# Patient Record
Sex: Female | Born: 2000 | Race: White | Hispanic: No | Marital: Single | Smoking: Never smoker
Health system: Southern US, Community
[De-identification: ages and names within clinical notes are randomized; demographics above are authoritative.]

## PROBLEM LIST (undated history)

## (undated) DIAGNOSIS — J45909 Unspecified asthma, uncomplicated: Secondary | ICD-10-CM

## (undated) DIAGNOSIS — J069 Acute upper respiratory infection, unspecified: Secondary | ICD-10-CM

## (undated) DIAGNOSIS — L509 Urticaria, unspecified: Secondary | ICD-10-CM

## (undated) HISTORY — DX: Unspecified asthma, uncomplicated: J45.909

## (undated) HISTORY — DX: Acute upper respiratory infection, unspecified: J06.9

## (undated) HISTORY — DX: Urticaria, unspecified: L50.9

## (undated) HISTORY — PX: NO PAST SURGERIES: SHX2092

---
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
## 2019-01-03 ENCOUNTER — Other Ambulatory Visit: Payer: Self-pay | Admitting: Family Medicine

## 2019-01-03 DIAGNOSIS — R221 Localized swelling, mass and lump, neck: Secondary | ICD-10-CM

## 2021-02-14 IMAGING — US US THYROID
1 series · 14 of 25 positions shown · non-contrast
Comparison: None.

CLINICAL DATA: Palpable abnormality.  Right-sided thyroid fullness.

EXAM:
THYROID ULTRASOUND
TECHNIQUE: Ultrasound examination of the thyroid gland and adjacent soft
tissues was performed.

[Series 1: us thyroid · 0.04mm/px · 44 acquisitions, 14 frames shown]
[im 1/44]
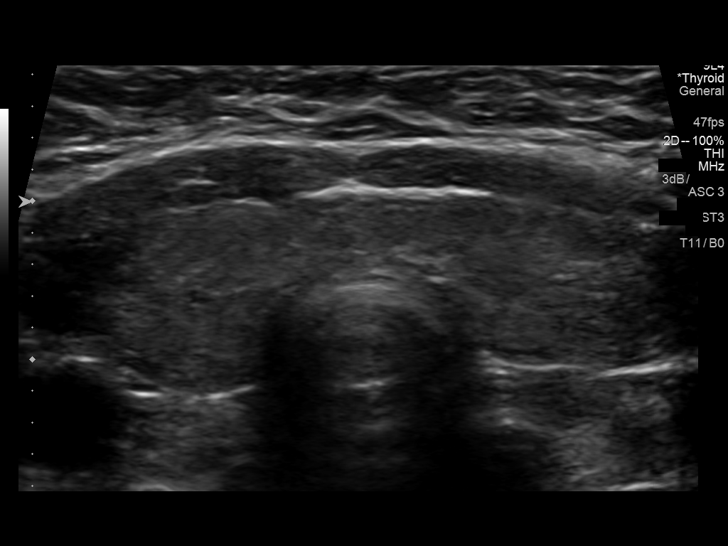
[im 4/44]
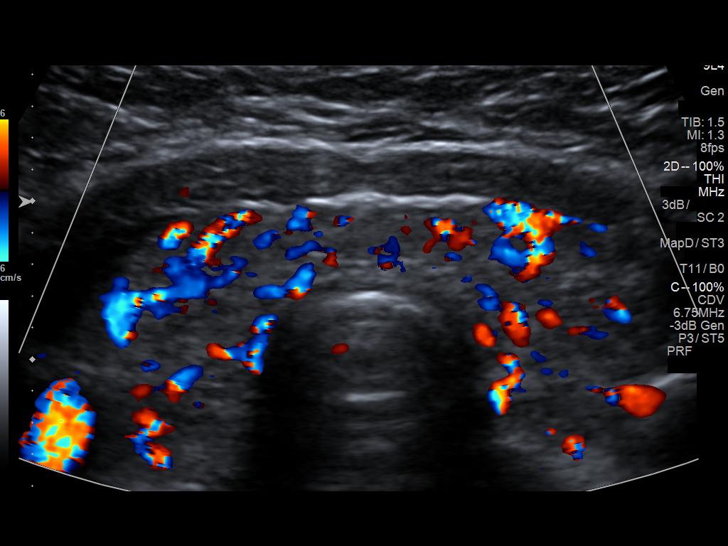
[im 8/44]
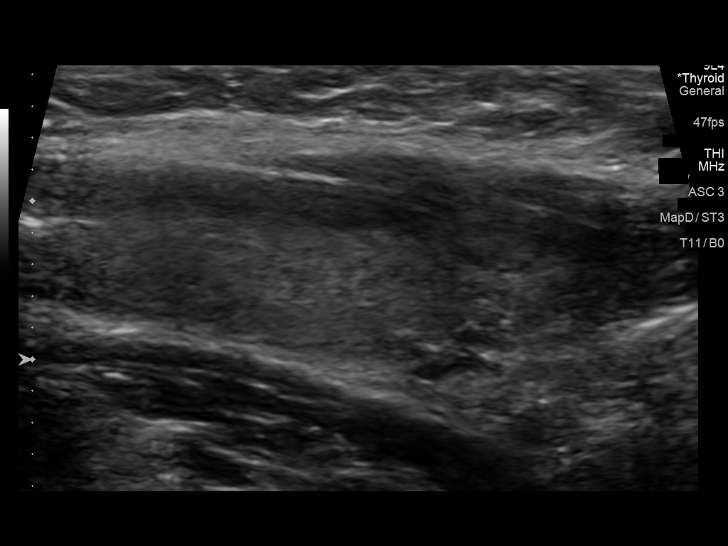
[im 11/44]
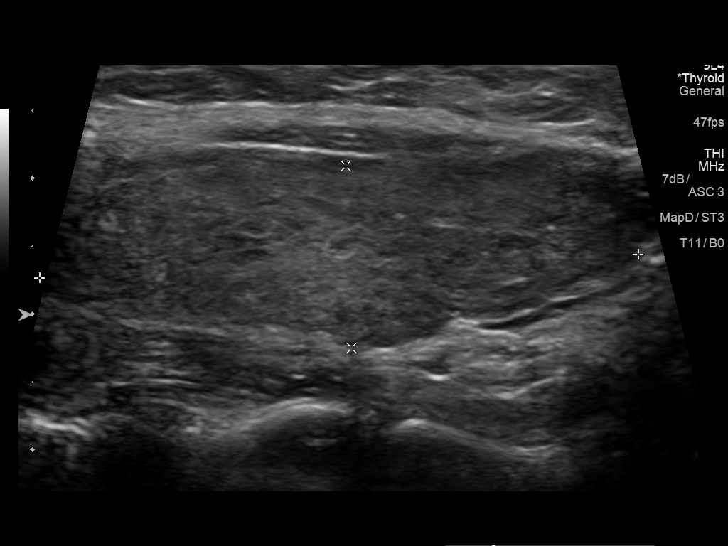
[im 15/44]
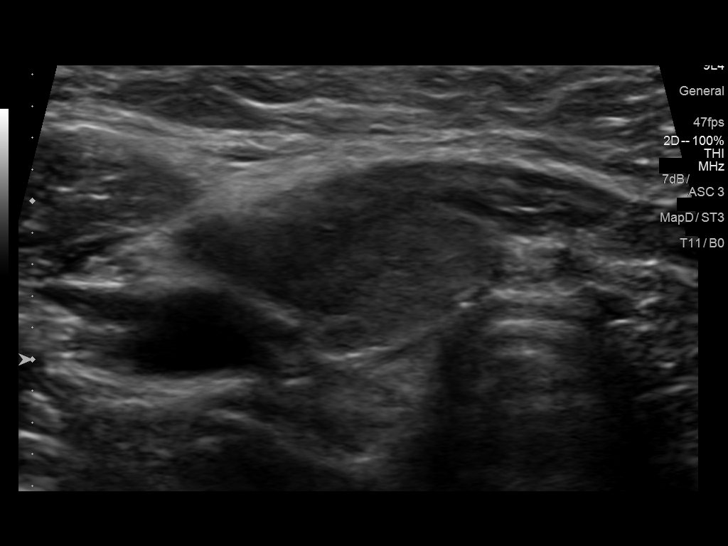
[im 17/44]
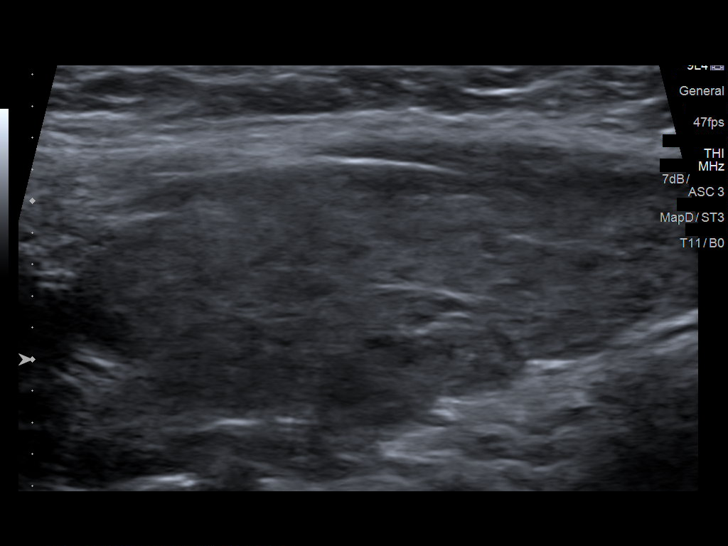
[im 20/44]
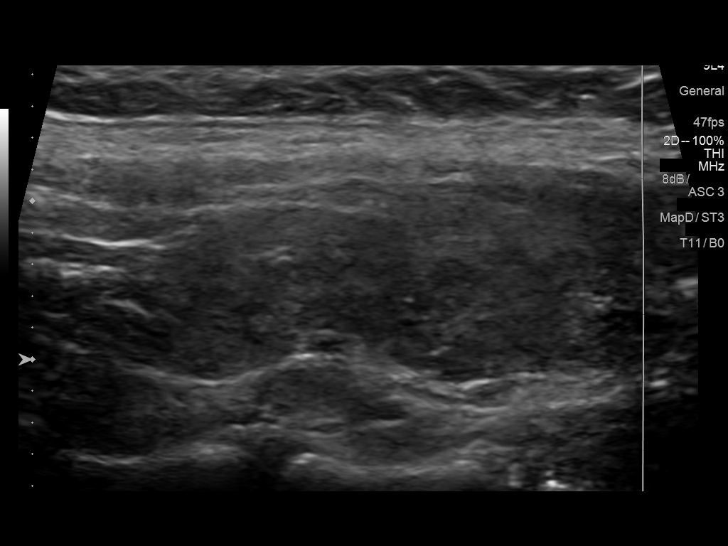
[im 24/44]
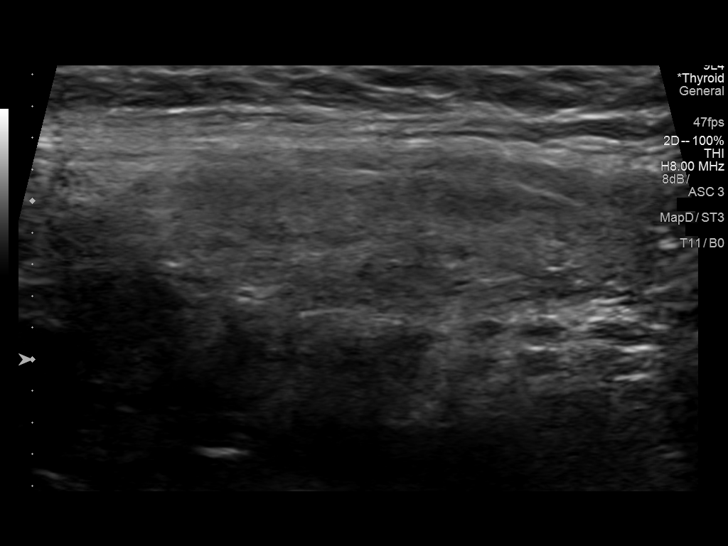
[im 27/44]
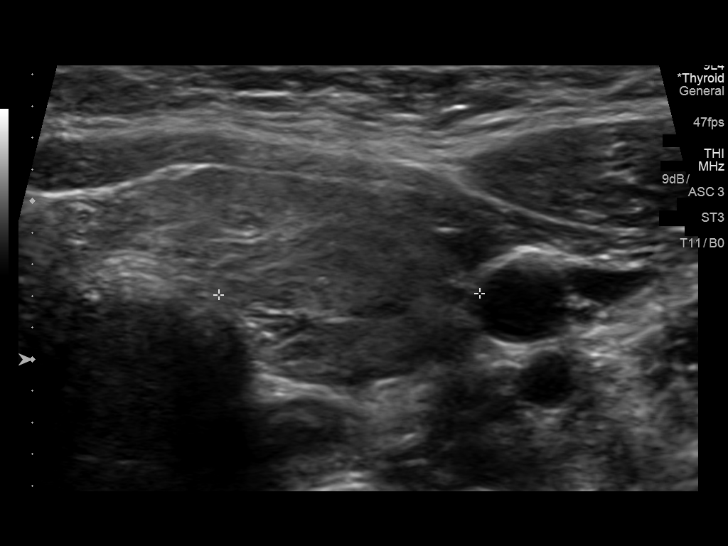
[im 29/44]
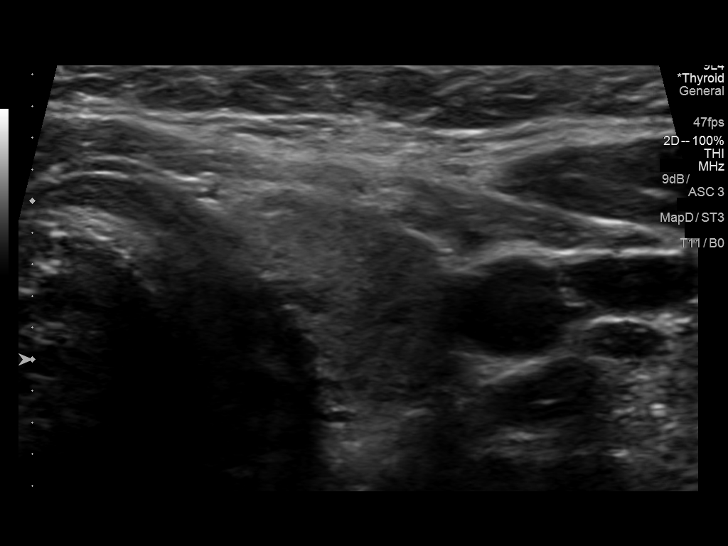
[im 33/44]
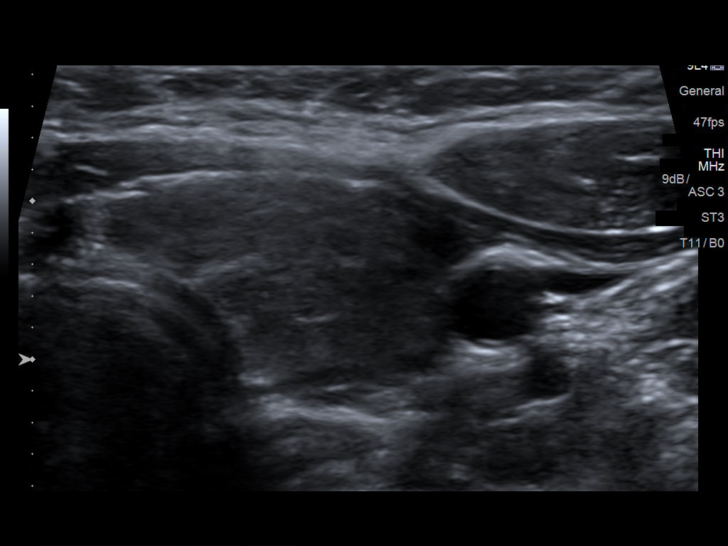
[im 36/44]
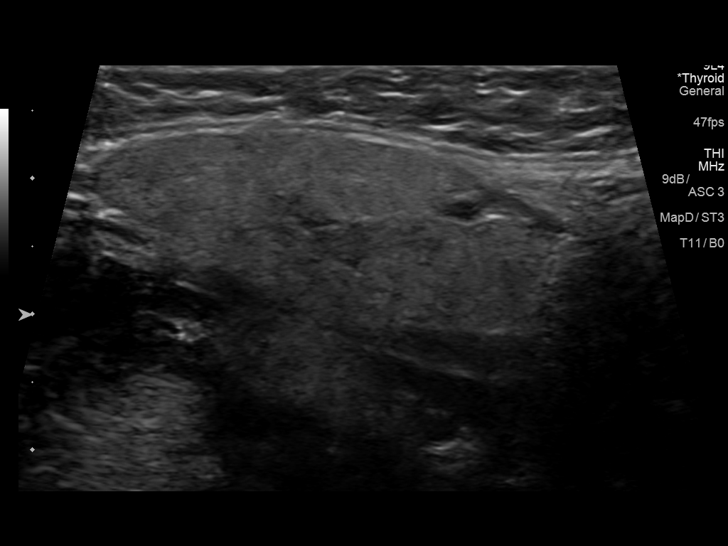
[im 40/44]
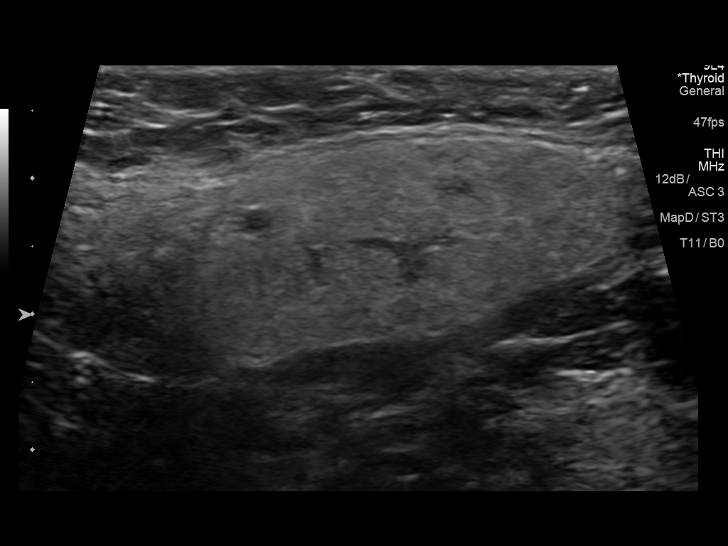
[im 44/44]
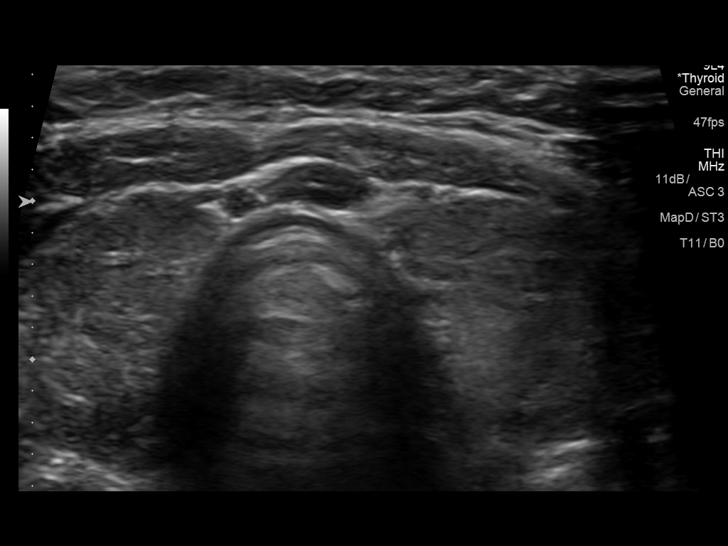

[14 of 25 positions shown; findings below may reference images not displayed]

FINDINGS: Parenchymal Echotexture: Moderately heterogenous - the gland appears
diffusely hyperemic (images 7, 22 and 37).

Isthmus: Normal in size measures 0.4 cm in diameter

Right lobe: Normal in size measuring 4.4 x 1.3 x 1.8 cm

Left lobe: There is normal in size measuring 4.6 x 1.4 x 1.7 cm

_________________________________________________________

Estimated total number of nodules >/= 1 cm: 0

Number of spongiform nodules >/=  2 cm not described below (TR1): 0

Number of mixed cystic and solid nodules >/= 1.5 cm not described
below (TR2): 0

_________________________________________________________

No discrete nodules are seen within the thyroid gland.
IMPRESSION: Normal sized though moderately heterogeneous and potentially
hyperemic thyroid gland without discrete nodule or mass. Findings
are nonspecific though could be seen in the setting of an active
thyroiditis. Clinical correlation is advised.

## 2023-02-12 ENCOUNTER — Ambulatory Visit: Payer: Self-pay | Admitting: Allergy & Immunology

## 2023-03-17 ENCOUNTER — Encounter: Payer: Self-pay | Admitting: Internal Medicine

## 2023-03-17 ENCOUNTER — Ambulatory Visit: Payer: 59 | Admitting: Internal Medicine

## 2023-03-17 ENCOUNTER — Other Ambulatory Visit: Payer: Self-pay

## 2023-03-17 VITALS — BP 118/86 | HR 103 | Temp 98.8°F | Resp 12 | Ht 63.58 in | Wt 215.8 lb

## 2023-03-17 DIAGNOSIS — J4540 Moderate persistent asthma, uncomplicated: Secondary | ICD-10-CM

## 2023-03-17 DIAGNOSIS — J3089 Other allergic rhinitis: Secondary | ICD-10-CM

## 2023-03-17 DIAGNOSIS — L500 Allergic urticaria: Secondary | ICD-10-CM | POA: Diagnosis not present

## 2023-03-17 MED ORDER — CETIRIZINE HCL 10 MG PO TABS: 10.0000 mg | ORAL_TABLET | Freq: Two times a day (BID) | ORAL | 5 refills | Status: AC | PRN

## 2023-03-17 MED ORDER — ALBUTEROL SULFATE HFA 108 (90 BASE) MCG/ACT IN AERS: 2.0000 | INHALATION_SPRAY | Freq: Four times a day (QID) | RESPIRATORY_TRACT | 1 refills | Status: AC | PRN

## 2023-03-17 MED ORDER — BREZTRI AEROSPHERE 160-9-4.8 MCG/ACT IN AERO: 2.0000 | INHALATION_SPRAY | Freq: Every day | RESPIRATORY_TRACT | 5 refills | Status: AC

## 2023-03-17 NOTE — Patient Instructions (Addendum)
 Moderate Persistent Asthma: - Maintenance inhaler: continue Breztri  2 puffs daily.   - For respiratory illness or asthma flare up, increase to Breztri  2 puffs twice daily for 1-2 weeks.  - Rescue inhaler: Albuterol  2 puffs via spacer or 1 vial via nebulizer every 4-6 hours as needed for respiratory symptoms of cough, shortness of breath, or wheezing Asthma control goals:  Full participation in all desired activities (may need albuterol  before activity) Albuterol  use two times or less a week on average (not counting use with activity) Cough interfering with sleep two times or less a month Oral steroids no more than once a year No hospitalizations   Other Allergic Rhinitis: - Hold all anti-histamines (Xyzal, Allegra, Zyrtec , Claritin, Benadryl, Pepcid) 3 days prior to next visit.   Urticaria (Hives): - At this time etiology of hives and swelling is unknown. Hives can be caused by a variety of different triggers including illness/infection, exercise, pressure, vibrations, extremes of temperature to name a few however majority of the time there is no identifiable trigger.    Follow up: 10 AM on 1/14 for skin testing 1-68, IDs okay

## 2023-03-17 NOTE — Progress Notes (Signed)
 NEW PATIENT  Date of Service/Encounter:  03/17/23  Consult requested by: Claudene Pellet, MD   Subjective:   Jane Pruitt (DOB: November 05, 2000) is a 23 y.o. female who presents to the clinic on 03/17/2023 with a chief complaint of Allergies (Says her allergies have gotten worse and would like to have another allergy  test to see what's changed.) .    History obtained from: chart review and patient.   Asthma:  Diagnosed in adulthood.  She gets episodes of SOB and chest tightness with her asthma flare ups.  It tends to worsen with her allergies. She is currently on Breztri  2 puffs daily which does help.   Few times with allergy  flare ups daytime symptoms in past month, rarely nighttime awakenings in past month Using rescue inhaler: rarely Limitations to daily activity: mild 2 ED visits/UC visits and 1 oral steroids in the past year 0 number of lifetime hospitalizations, 0 number of lifetime intubations.  Identified Triggers:  allergies and respiratory illness Prior PFTs or spirometry: none  Previously used therapies: Symbicort  Current regimen:  Maintenance: Breztri  2 puffs daily  Rescue: Albuterol  2 puffs q4-6 hrs PRN  Rhinitis:  Started since she was a young adult. Her allergies in the most recent years have worsened and are bothering her a lot more.  She is considering AIT.  Symptoms include: nasal congestion, post nasal drainage, and sneezing, eye swelling/puffiness  Occurs year-round  Potential triggers: pets   Treatments tried:  Zyrtec  PRN  Previous allergy  testing: yes in 2017, can't recall exact results  History of sinus surgery: no Nonallergic triggers: none    Hives: Started 5 years ago.  Currently occurring every other day.  Cold weather causes break outs.  Lasts few hours.  No scarring/no pain. They do itch.  Benadryl PRN does help.    Reviewed:  02/23/2023: seen by Ann NP for low back pain.  Previous medications include albuterol , symbicort, Epipen ,  azithromycin, prednisone, mefenamic acid, methocarbamol, amoxil, Epipen .    12/25/2015: seen by Dr Iva Allergy  for urticaria, contact dermatitis, chronic rhinitis and exercise induced bronchospasm. Discussed considering path testing.  Start Albuterol , Flonase , Zyrtec , Singulair .   12/2015: SPT positive to trees, grasses, weeds, molds, DM, dogs, CR, mouch    Past Medical History: Past Medical History:  Diagnosis Date   Asthma    Recurrent upper respiratory infection (URI)    Urticaria     Past Surgical History: Past Surgical History:  Procedure Laterality Date   NO PAST SURGERIES      Family History: Family History  Problem Relation Age of Onset   Urticaria Mother    Food Allergy  Mother    Asthma Father    Asthma Brother    Allergic rhinitis Neg Hx    Angioedema Neg Hx    Atopy Neg Hx    Eczema Neg Hx    Immunodeficiency Neg Hx     Social History:  Flooring in bedroom: engineer, civil (consulting) Pets: cat and dog Tobacco use/exposure: none Job: CMA  Medication List:  Allergies as of 03/17/2023       Reactions   Penicillins Hives   Per patient    Amoxicillin-pot Clavulanate Hives        Medication List        Accurate as of March 17, 2023  3:23 PM. If you have any questions, ask your nurse or doctor.          STOP taking these medications    Albuterol  Sulfate 108 (90 Base) MCG/ACT  Aepb Commonly known as: ProAir  RespiClick Replaced by: albuterol  108 (90 Base) MCG/ACT inhaler Stopped by: Jane Pruitt       TAKE these medications    albuterol  108 (90 Base) MCG/ACT inhaler Commonly known as: VENTOLIN  HFA Inhale 2 puffs into the lungs every 6 (six) hours as needed. Replaces: Albuterol  Sulfate 108 (90 Base) MCG/ACT Aepb Started by: Jane Pruitt   Breztri  Aerosphere 160-9-4.8 MCG/ACT Aero Generic drug: Budeson-Glycopyrrol-Formoterol Inhale 2 puffs into the lungs daily. Started by: Jane Pruitt   cetirizine  10 MG tablet Commonly known as: ZyrTEC   Allergy  Take 1 tablet (10 mg total) by mouth 2 (two) times daily as needed for allergies (or hives). Started by: Jane Pruitt   cyclobenzaprine 5 MG tablet Commonly known as: FLEXERIL Take 5 mg by mouth as needed for muscle spasms.   EPINEPHrine  0.3 mg/0.3 mL Soaj injection Commonly known as: EPI-PEN Inject 0.3 mg into the muscle as needed.   fluticasone  50 MCG/ACT nasal spray Commonly known as: FLONASE  Place 2 sprays into both nostrils daily.   IBU 800 MG tablet Generic drug: ibuprofen Take 800 mg by mouth as needed for mild pain (pain score 1-3).   montelukast  10 MG tablet Commonly known as: SINGULAIR  TAKE ONE TABLET BY MOUTH EVERY NIGHT AT BEDTIME         REVIEW OF SYSTEMS: Pertinent positives and negatives discussed in HPI.   Objective:   Physical Exam: BP 118/86 (BP Location: Right Arm, Patient Position: Sitting, Cuff Size: Normal)   Pulse (!) 103   Temp 98.8 F (37.1 C) (Temporal)   Resp 12   Ht 5' 3.58 (1.615 m)   Wt 215 lb 12.8 oz (97.9 kg)   SpO2 95%   BMI 37.53 kg/m  Body mass index is 37.53 kg/m. GEN: alert, well developed HEENT: clear conjunctiva, nose with + mild inferior turbinate hypertrophy, pink nasal mucosa, + clear rhinorrhea, + cobblestoning HEART: regular rate and rhythm, no murmur LUNGS: clear to auscultation bilaterally, no coughing, unlabored respiration ABDOMEN: soft, non distended  SKIN: no rashes or lesions  Spirometry:  Tracings reviewed. Her effort: It was hard to get consistent efforts and there is a question as to whether this reflects a maximal maneuver. FVC: 3.62L, 98% predicted FEV1: 2.52L, 79% predicted FEV1/FVC ratio: 70% Interpretation: Spirometry consistent with normal pattern.  Please see scanned spirometry results for details.  Assessment:   1. Other allergic rhinitis   2. Allergic urticaria   3. Moderate persistent asthma without complication     Plan/Recommendations:  Moderate Persistent Asthma: - MDI  technique discussed. Spirometry without obstruction.   - Maintenance inhaler: continue Breztri  160-9-4.63mcg 2 puffs daily.   - For respiratory illness or asthma flare up, increase to Breztri  2 puffs twice daily for 1-2 weeks.  - Rescue inhaler: Albuterol  2 puffs via spacer or 1 vial via nebulizer every 4-6 hours as needed for respiratory symptoms of cough, shortness of breath, or wheezing Asthma control goals:  Full participation in all desired activities (may need albuterol  before activity) Albuterol  use two times or less a week on average (not counting use with activity) Cough interfering with sleep two times or less a month Oral steroids no more than once a year No hospitalizations   Other Allergic Rhinitis: - Due to turbinate hypertrophy, seasonal symptoms and unresponsive to over the counter meds, will perform skin testing to identify aeroallergen triggers.   - Hold all anti-histamines (Xyzal, Allegra, Zyrtec , Claritin, Benadryl, Pepcid) 3 days prior to  next visit.   Urticaria (Hives): - At this time etiology of hives and swelling is unknown. Hives can be caused by a variety of different triggers including illness/infection, exercise, pressure, vibrations, extremes of temperature to name a few however majority of the time there is no identifiable trigger.    Follow up: 10 AM on 1/14 for skin testing 1-68, IDs okay   Jane Blanch, MD [facility] 

## 2023-03-24 ENCOUNTER — Encounter: Payer: Self-pay | Admitting: Internal Medicine

## 2023-03-24 ENCOUNTER — Ambulatory Visit: Payer: 59 | Admitting: Internal Medicine

## 2023-03-24 DIAGNOSIS — J3089 Other allergic rhinitis: Secondary | ICD-10-CM

## 2023-03-24 DIAGNOSIS — J301 Allergic rhinitis due to pollen: Secondary | ICD-10-CM | POA: Diagnosis not present

## 2023-03-24 DIAGNOSIS — L500 Allergic urticaria: Secondary | ICD-10-CM | POA: Diagnosis not present

## 2023-03-24 DIAGNOSIS — J3081 Allergic rhinitis due to animal (cat) (dog) hair and dander: Secondary | ICD-10-CM

## 2023-03-24 MED ORDER — AZELASTINE HCL 0.1 % NA SOLN: 2.0000 | Freq: Two times a day (BID) | NASAL | 5 refills | Status: AC | PRN

## 2023-03-24 MED ORDER — FLUTICASONE PROPIONATE 50 MCG/ACT NA SUSP: 2.0000 | Freq: Every day | NASAL | 5 refills | Status: AC

## 2023-03-24 NOTE — Patient Instructions (Addendum)
 Moderate Persistent Asthma: - Maintenance inhaler: continue Breztri  160-9-4.79mcg 2 puffs daily.   - For respiratory illness or asthma flare up, increase to Breztri  2 puffs twice daily for 1-2 weeks.  - Rescue inhaler: Albuterol  2 puffs via spacer or 1 vial via nebulizer every 4-6 hours as needed for respiratory symptoms of cough, shortness of breath, or wheezing Asthma control goals:  Full participation in all desired activities (may need albuterol  before activity) Albuterol  use two times or less a week on average (not counting use with activity) Cough interfering with sleep two times or less a month Oral steroids no more than once a year No hospitalizations  Allergic Rhinitis: - Positive skin test 03/2023: trees, grasses, weeds, dogs, cats, molds  - Avoidance measures discussed below.  - Use nasal saline rinses before nose sprays such as with Neilmed Sinus Rinse.  Use distilled water.   - Use Flonase  2 sprays each nostril daily. Aim upward and outward. - Use Azelastine  1-2 sprays each nostril twice daily as needed for runny nose, drainage, sneezing, congestion. Aim upward and outward. - Use Zyrtec  10 mg daily or twice daily, dosing dependent on hives.  - Consider allergy  shots as long term control of your symptoms by teaching your immune system to be more tolerant of your allergy  triggers  Urticaria (Hives): - At this time etiology of hives and swelling is unknown. Hives can be caused by a variety of different triggers including illness/infection, exercise, pressure, vibrations, extremes of temperature to name a few however majority of the time there is no identifiable trigger.  -Start Zyrtec  10mg  twice daily.  If hives improve, can decrease to once daily.  -If no improvement in a week, continue Zyrtec  10mg  daily and add Pepcid 20mg  twice daily.    ALLERGEN AVOIDANCE MEASURES    Molds - Indoor avoidance Use air conditioning to reduce indoor humidity.  Do not use a humidifier. Keep  indoor humidity at 30 - 40%.  Use a dehumidifier if needed. In the bathroom use an exhaust fan or open a window after showering.  Wipe down damp surfaces after showering.  Clean bathrooms with a mold-killing solution (diluted bleach, or products like Tilex, etc) at least once a month. In the kitchen use an exhaust fan to remove steam from cooking.  Throw away spoiled foods immediately, and empty garbage daily.  Empty water pans below self-defrosting refrigerators frequently. Vent the clothes dryer to the outside. Limit indoor houseplants; mold grows in the dirt.  No houseplants in the bedroom. Remove carpet from the bedroom. Encase the mattress and box springs with a zippered encasing.  Molds - Outdoor avoidance Avoid being outside when the grass is being mowed, or the ground is tilled. Avoid playing in leaves, pine straw, hay, etc.  Dead plant materials contain mold. Avoid going into barns or grain storage areas. Remove leaves, clippings and compost from around the home.  Pollen Avoidance Pollen levels are highest during the mid-day and afternoon.  Consider this when planning outdoor activities. Avoid being outside when the grass is being mowed, or wear a mask if the pollen-allergic person must be the one to mow the grass. Keep the windows closed to keep pollen outside of the home. Use an air conditioner to filter the air. Take a shower, wash hair, and change clothing after working or playing outdoors during pollen season. Pet Dander Keep the pet out of your bedroom and restrict it to only a few rooms. Be advised that keeping the pet in only  one room will not limit the allergens to that room. Don't pet, hug or kiss the pet; if you do, wash your hands with soap and water. High-efficiency particulate air (HEPA) cleaners run continuously in a bedroom or living room can reduce allergen levels over time. Regular use of a high-efficiency vacuum cleaner or a central vacuum can reduce allergen  levels. Giving your pet a bath at least once a week can reduce airborne allergen.

## 2023-03-24 NOTE — Progress Notes (Signed)
 FOLLOW UP Date of Service/Encounter:  03/24/23   Subjective:  Jane Pruitt (DOB: 06/08/2000) is a 23 y.o. female who [facility] for follow up for skin testing.   History obtained from: chart review and patient.  Anti histamines held.   Past Medical History: Past Medical History:  Diagnosis Date   Asthma    Recurrent upper respiratory infection (URI)    Urticaria     Objective:  There were no vitals taken for this visit. There is no height or weight on file to calculate BMI. Physical Exam: GEN: alert, well developed HEENT: clear conjunctiva, MMM LUNGS: unlabored respiration   Skin Testing:  Skin prick testing was placed, which includes aeroallergens/foods, histamine control, and saline control.  Verbal consent was obtained prior to placing test.  Patient tolerated procedure well.  Allergy  testing results were read and interpreted by myself, documented by clinical staff. Adequate positive and negative control.  Positive results to:  Results discussed with patient/family.  Airborne Adult Perc - 03/24/23 1009     Time Antigen Placed 1009    Allergen Manufacturer Greer    Location Back    Number of Test 55    Panel 1 Select    1. Control-Buffer 50% Glycerol Negative    2. Control-Histamine 3+    3. Bahia Negative    4. Bermuda Negative    5. Johnson Negative    6. [place]  Blue Negative    7. Meadow Fescue Negative    8. Perennial Rye Negative    10. Ragweed Mix Negative    11. Cocklebur Negative    12. Plantain,  English Negative    13. Baccharis Negative    14. Dog Fennel Negative    15. Russian Thistle Negative    16. Lamb's Quarters Negative    17. Sheep Sorrell Negative    18. Rough Pigweed Negative    19. Marsh Elder, Rough Negative    20. Mugwort, Common Negative    21. Box, Elder 2+    22. Cedar, red Negative    23. Sweet Gum Negative    24. Pecan Pollen Negative    25. Pine Mix Negative    26.  Walnut, Black Pollen Negative    27. Red Mulberry Negative    28. Ash Mix Negative    29. Birch Mix Negative    30. Beech American Negative    31. Cottonwood, Eastern Negative    32. Hickory, White Negative    33. Maple Mix Negative    34. Oak, Eastern Mix Negative    35. Sycamore Eastern Negative    36. Alternaria Alternata Negative    37. Cladosporium Herbarum Negative    38. Aspergillus Mix Negative    39. Penicillium Mix Negative    40. Bipolaris Sorokiniana (Helminthosporium) Negative    41. Drechslera Spicifera (Curvularia) Negative    43. Fusarium Moniliforme Negative    44. Aureobasidium Pullulans (pullulara) Negative    45. Rhizopus Oryzae Negative    46. Botrytis Cinera Negative    47. Epicoccum Nigrum Negative    48. Phoma Betae Negative    49. Dust Mite Mix Negative    50. Cat Hair 10,000 BAU/ml 3+    51.  Dog Epithelia 3+    52. Mixed Feathers Negative    53. Horse Epithelia Negative    54. Cockroach, German Negative    55. Tobacco Leaf Negative  13 Food Perc - 03/24/23 1009       Test Information   Time Antigen Placed 1009    Allergen Manufacturer Jestine    Location Back    Number of allergen test 13    Food Select      Food   1. Peanut Negative    2. Soybean Negative    3. Wheat Negative    4. Sesame Negative    5. Milk, Cow Negative    6. Casein Negative    7. Egg White, Chicken Negative    8. Shellfish Mix Negative    9. Fish Mix Negative    10. Cashew Negative    11. Walnut Food Negative    12. Almond Negative    13. Hazelnut Negative             Intradermal - 03/24/23 1147     Time Antigen Placed 1130    Allergen Manufacturer Jestine    Location Arm    Number of Test 14    Intradermal Select    Control Negative    Bahia 2+    Bermuda 2+    Johnson Negative    7 Grass 3+    Ragweed Mix 2+    Weed Mix 2+    Tree Mix 3+    Mold 1 3+    Mold 2 Negative    Mold 3 2+    Mold 4 2+    Mite Mix Negative     Cockroach Negative              Assessment:   1. Allergic urticaria   2. Seasonal allergic rhinitis due to pollen   3. Allergic rhinitis due to animal hair or dander   4. Allergic rhinitis caused by mold     Plan/Recommendations:  Moderate Persistent Asthma: - Maintenance inhaler: continue Breztri  160-9-4.35mcg 2 puffs daily.   - For respiratory illness or asthma flare up, increase to Breztri  2 puffs twice daily for 1-2 weeks.  - Rescue inhaler: Albuterol  2 puffs via spacer or 1 vial via nebulizer every 4-6 hours as needed for respiratory symptoms of cough, shortness of breath, or wheezing Asthma control goals:  Full participation in all desired activities (may need albuterol  before activity) Albuterol  use two times or less a week on average (not counting use with activity) Cough interfering with sleep two times or less a month Oral steroids no more than once a year No hospitalizations  Allergic Rhinitis: - Due to turbinate hypertrophy, seasonal symptoms, uncontrolled hives and unresponsive to over the counter meds, will perform skin testing to identify aeroallergen triggers.   - Positive skin test 03/2023: trees, grasses, weeds, dogs, cats, molds  - Avoidance measures discussed. - Use nasal saline rinses before nose sprays such as with Neilmed Sinus Rinse.  Use distilled water.   - Use Flonase  2 sprays each nostril daily. Aim upward and outward. - Use Azelastine  1-2 sprays each nostril twice daily as needed for runny nose, drainage, sneezing, congestion. Aim upward and outward. - Use Zyrtec  10 mg daily or twice daily, dosing dependent on hives.  - Consider allergy  shots as long term control of your symptoms by teaching your immune system to be more tolerant of your allergy  triggers   Urticaria (Hives): - At this time etiology of hives and swelling is unknown. Hives can be caused by a variety of different triggers including illness/infection, exercise, pressure, vibrations,  extremes of temperature to name a few however majority  of the time there is no identifiable trigger.  - SPT 03/2023: negative to commonly allergenic foods.  Positive to aeroallergens as discussed above.  -Start Zyrtec  10mg  twice daily.  If hives improve, can decrease to once daily.  -If no improvement in a week, continue Zyrtec  10mg  daily and add Pepcid 20mg  twice daily.    ALLERGEN AVOIDANCE MEASURES    Molds - Indoor avoidance Use air conditioning to reduce indoor humidity.  Do not use a humidifier. Keep indoor humidity at 30 - 40%.  Use a dehumidifier if needed. In the bathroom use an exhaust fan or open a window after showering.  Wipe down damp surfaces after showering.  Clean bathrooms with a mold-killing solution (diluted bleach, or products like Tilex, etc) at least once a month. In the kitchen use an exhaust fan to remove steam from cooking.  Throw away spoiled foods immediately, and empty garbage daily.  Empty water pans below self-defrosting refrigerators frequently. Vent the clothes dryer to the outside. Limit indoor houseplants; mold grows in the dirt.  No houseplants in the bedroom. Remove carpet from the bedroom. Encase the mattress and box springs with a zippered encasing.  Molds - Outdoor avoidance Avoid being outside when the grass is being mowed, or the ground is tilled. Avoid playing in leaves, pine straw, hay, etc.  Dead plant materials contain mold. Avoid going into barns or grain storage areas. Remove leaves, clippings and compost from around the home.  Pollen Avoidance Pollen levels are highest during the mid-day and afternoon.  Consider this when planning outdoor activities. Avoid being outside when the grass is being mowed, or wear a mask if the pollen-allergic person must be the one to mow the grass. Keep the windows closed to keep pollen outside of the home. Use an air conditioner to filter the air. Take a shower, wash hair, and change clothing after working  or playing outdoors during pollen season. Pet Dander Keep the pet out of your bedroom and restrict it to only a few rooms. Be advised that keeping the pet in only one room will not limit the allergens to that room. Don't pet, hug or kiss the pet; if you do, wash your hands with soap and water. High-efficiency particulate air (HEPA) cleaners run continuously in a bedroom or living room can reduce allergen levels over time. Regular use of a high-efficiency vacuum cleaner or a central vacuum can reduce allergen levels. Giving your pet a bath at least once a week can reduce airborne allergen.       Return in about 2 months (around 05/22/2023).  Arleta Blanch, MD [facility] 

## 2023-05-27 ENCOUNTER — Ambulatory Visit: Payer: 59 | Admitting: Internal Medicine

## 2023-06-03 ENCOUNTER — Ambulatory Visit: Admitting: Internal Medicine
